# Patient Record
Sex: Female | Born: 2005 | Race: White | Hispanic: No | Marital: Single | Smoking: Never smoker
Health system: Southern US, Community
[De-identification: ages and names within clinical notes are randomized; demographics above are authoritative.]

---
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
## 2024-02-17 ENCOUNTER — Ambulatory Visit

## 2024-02-18 ENCOUNTER — Ambulatory Visit (INDEPENDENT_AMBULATORY_CARE_PROVIDER_SITE_OTHER)

## 2024-02-18 ENCOUNTER — Other Ambulatory Visit (HOSPITAL_COMMUNITY)
Admission: RE | Admit: 2024-02-18 | Discharge: 2024-02-18 | Disposition: A | Discharge status: Home / Self Care | Source: Ambulatory Visit

## 2024-02-18 VITALS — BP 109/79 | HR 80 | Ht 62.0 in | Wt 124.0 lb

## 2024-02-18 DIAGNOSIS — N930 Postcoital and contact bleeding: Secondary | ICD-10-CM | POA: Diagnosis not present

## 2024-02-18 DIAGNOSIS — Z30431 Encounter for routine checking of intrauterine contraceptive device: Secondary | ICD-10-CM

## 2024-02-18 DIAGNOSIS — Z975 Presence of (intrauterine) contraceptive device: Secondary | ICD-10-CM | POA: Diagnosis present

## 2024-02-18 NOTE — Progress Notes (Signed)
 Last visit 09/2021 for IUD recheck. No visits since then. C/O light spotting after intercourse at times. Concerned.

## 2024-02-18 NOTE — Progress Notes (Signed)
`    GYNECOLOGY PROBLEM OFFICE VISIT NOTE  History:  Felicia Richards is a 18 y.o. G0P0000 here today for postcoital bleeding.  She states this started " a couple weeks ago" and is not consistent.  She states the spotting is noted "a couple hours after" and is "a faint line" that does not require tampon or pad usage. She denies pain and reports condom usage during sexual activity.  She denies vaginal irritation or odor.    She reports amenorrhea d/t IUD that was placed March 2022.    History reviewed. No pertinent past medical history.  History reviewed. No pertinent surgical history.  The following portions of the patient's history were reviewed and updated as appropriate: allergies, current medications, past family history, past medical history, past social history, past surgical history and problem list.   Health Maintenance:  No pap or mammogram on file d/t age.   Review of Systems:  Genito-Urinary ROS: negative Gastrointestinal ROS: negative Objective:  Vitals: BP 109/79   Pulse 80   Ht 5\' 2"  (1.575 m)   Wt 124 lb (56.2 kg)   LMP 02/12/2021 (Approximate)   BMI 22.68 kg/m   Physical Exam: Physical Exam Constitutional:      Appearance: Normal appearance.  Genitourinary:     Rectum normal.     Right Labia: No tenderness or lesions.    Left Labia: No tenderness or lesions.    Vaginal discharge (Creamy white, scant amt. CV collected) present.     No vaginal bleeding.      Right Adnexa: not tender and not full.    Left Adnexa: not tender and not full.    Cervix is nulliparous.     No cervical discharge, friability, lesion, polyp or nabothian cyst.     IUD strings visualized.     Uterus is not enlarged or tender.  HENT:     Head: Normocephalic and atraumatic.  Eyes:     Conjunctiva/sclera: Conjunctivae normal.  Cardiovascular:     Rate and Rhythm: Normal rate.  Musculoskeletal:        General: Normal range of motion.     Cervical back: Normal range of motion.   Neurological:     Mental Status: She is alert and oriented to person, place, and time.  Skin:    General: Skin is warm and dry.  Psychiatric:        Mood and Affect: Mood normal.        Behavior: Behavior normal.  Vitals reviewed.      Labs and Imaging: No results found.  Assessment & Plan:  18 year old Post Coital Bleeding IUD in Place  -Discussed possible causes for bleeding including trauma associated with sexual activity. -Reassurance given that bleeding is "faint," inconsistent, and without pain. -Recommend STI testing and patient offered and accepts STD testing. -Reassured that IUD strings noted and appears normal. -CV collected and results anticipated in 24 hours.  Will treat accordingly. -Encouraged to monitor and report worsening symptoms. -RTO for annual exam or prn.    Total face-to-face time with patient:  15  minutes with additional 5 minutes need for chart review.    Gerrit Heck, CNM 02/18/2024 9:14 AM

## 2024-02-21 LAB — CERVICOVAGINAL ANCILLARY ONLY
Bacterial Vaginitis (gardnerella): NEGATIVE
Candida Glabrata: NEGATIVE
Candida Vaginitis: POSITIVE — AB
Chlamydia: NEGATIVE
Comment: NEGATIVE
Comment: NEGATIVE
Comment: NEGATIVE
Comment: NEGATIVE
Comment: NEGATIVE
Comment: NORMAL
Neisseria Gonorrhea: NEGATIVE
Trichomonas: NEGATIVE

## 2024-02-22 MED ORDER — FLUCONAZOLE 150 MG PO TABS: 150.0000 mg | ORAL_TABLET | Freq: Once | ORAL | 0 refills | Status: DC

## 2024-02-22 NOTE — Addendum Note (Signed)
 Addended by: Gerrit Heck L on: 02/22/2024 05:39 PM   Modules accepted: Orders

## 2024-02-23 ENCOUNTER — Other Ambulatory Visit: Payer: Self-pay

## 2024-02-23 MED ORDER — FLUCONAZOLE 150 MG PO TABS: 150.0000 mg | ORAL_TABLET | Freq: Once | ORAL | 0 refills | Status: AC

## 2024-02-23 NOTE — Progress Notes (Signed)
 Rx diflucan from 4/8 sent to different pharmacy since pt is currently in school per pt request.
